# Patient Record
Sex: Male | Born: 2000 | Race: Black or African American | Hispanic: No | Marital: Single | Smoking: Never smoker
Health system: Southern US, Community
[De-identification: ages and names within clinical notes are randomized; demographics above are authoritative.]

## PROBLEM LIST (undated history)

## (undated) DIAGNOSIS — J45909 Unspecified asthma, uncomplicated: Secondary | ICD-10-CM

## (undated) HISTORY — PX: OTHER SURGICAL HISTORY: SHX169

---
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
## 2021-01-04 DIAGNOSIS — Z419 Encounter for procedure for purposes other than remedying health state, unspecified: Secondary | ICD-10-CM | POA: Diagnosis not present

## 2021-02-03 DIAGNOSIS — Z419 Encounter for procedure for purposes other than remedying health state, unspecified: Secondary | ICD-10-CM | POA: Diagnosis not present

## 2021-03-06 DIAGNOSIS — Z419 Encounter for procedure for purposes other than remedying health state, unspecified: Secondary | ICD-10-CM | POA: Diagnosis not present

## 2021-04-05 DIAGNOSIS — Z419 Encounter for procedure for purposes other than remedying health state, unspecified: Secondary | ICD-10-CM | POA: Diagnosis not present

## 2021-04-28 IMAGING — CR DG ELBOW COMPLETE 3+V*R*
1 series · 4 of 4 positions shown · non-contrast
Comparison: None.

CLINICAL DATA: Fall playing basketball

EXAM:
RIGHT ELBOW - COMPLETE 3+ VIEW

[Series 1: dg elbow complete right (3+view) · 0.14mm/px · 4 of 4 slices shown]
[im 1/4]
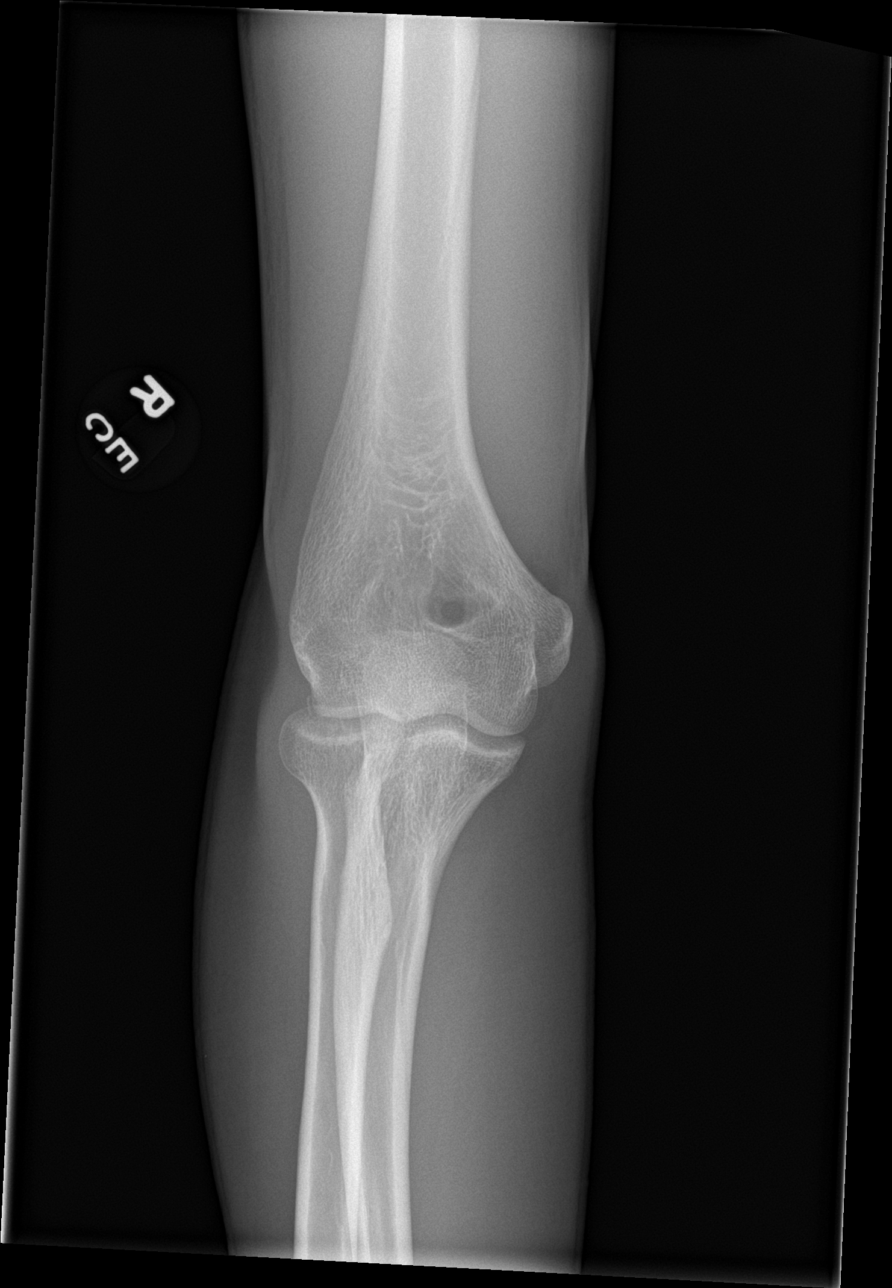
[im 2/4]
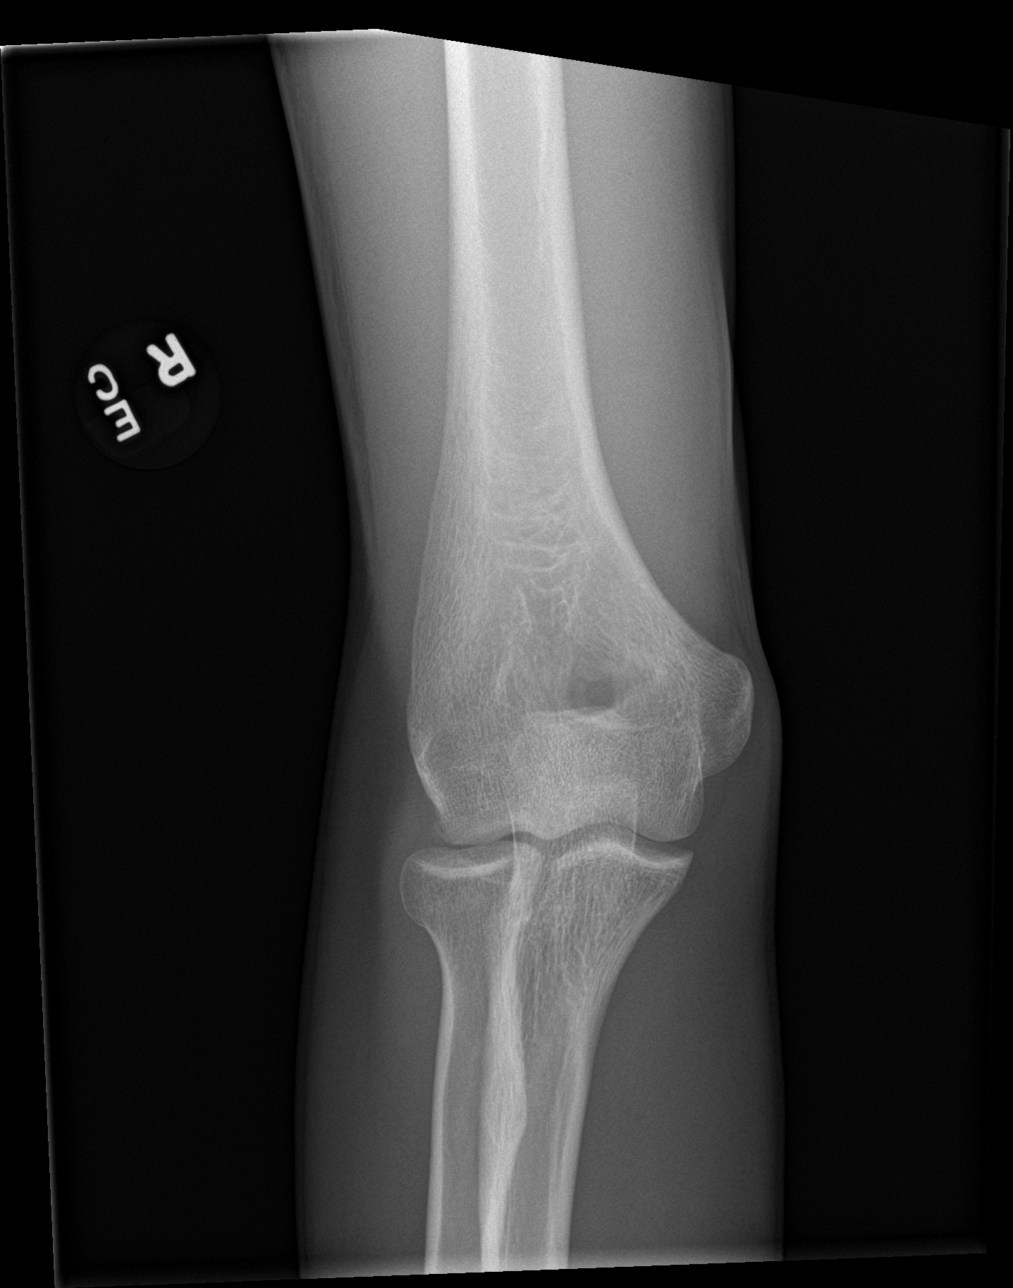
[im 3/4]
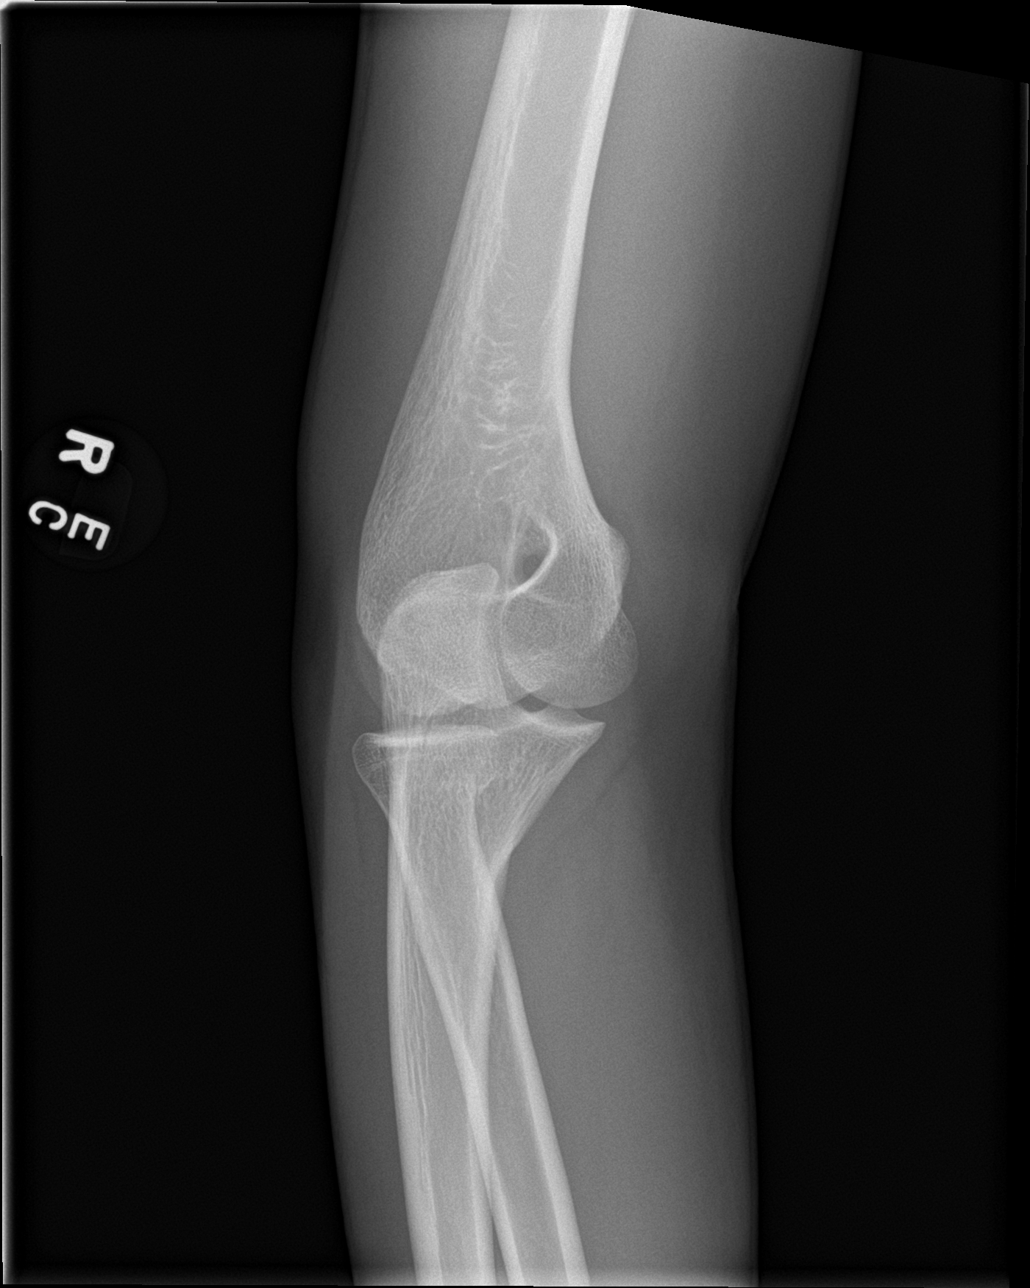
[im 4/4]
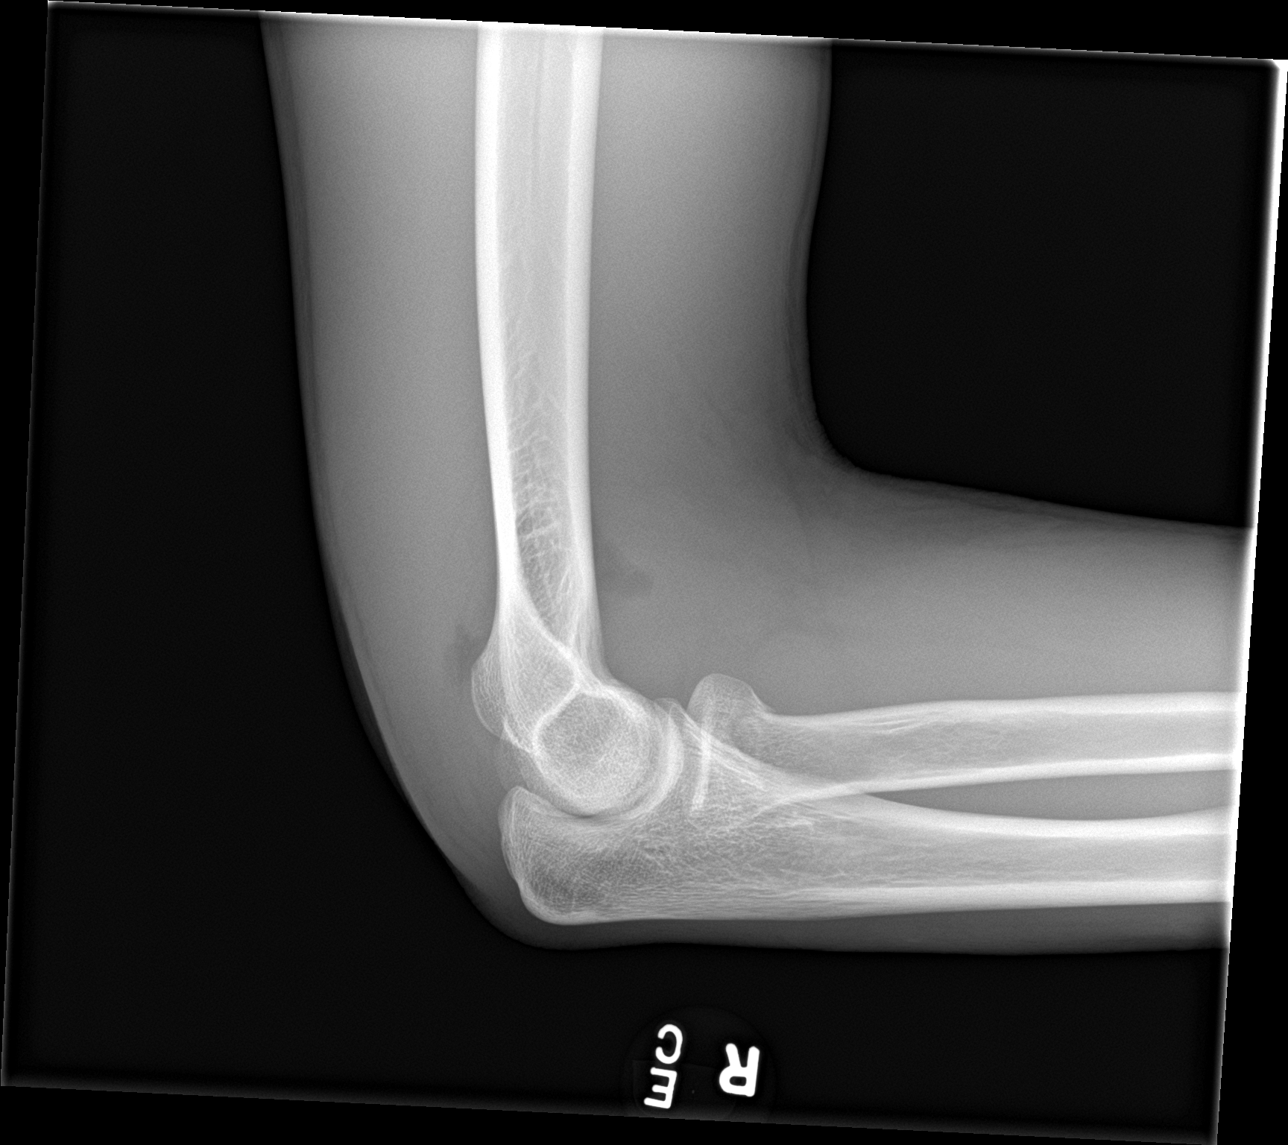

[4 of 4 positions shown; findings below may reference images not displayed]

FINDINGS: There is a right radial neck fracture with associated joint
effusion. Fracture is nondisplaced. No additional fracture. No
subluxation or dislocation.
IMPRESSION: Nondisplaced radial neck fracture.  Associated joint effusion.

## 2021-05-01 DIAGNOSIS — Z68.41 Body mass index (BMI) pediatric, 5th percentile to less than 85th percentile for age: Secondary | ICD-10-CM | POA: Diagnosis not present

## 2021-05-01 DIAGNOSIS — Z113 Encounter for screening for infections with a predominantly sexual mode of transmission: Secondary | ICD-10-CM | POA: Diagnosis not present

## 2021-05-01 DIAGNOSIS — Z713 Dietary counseling and surveillance: Secondary | ICD-10-CM | POA: Diagnosis not present

## 2021-05-01 DIAGNOSIS — J4520 Mild intermittent asthma, uncomplicated: Secondary | ICD-10-CM | POA: Diagnosis not present

## 2021-05-01 DIAGNOSIS — Z0000 Encounter for general adult medical examination without abnormal findings: Secondary | ICD-10-CM | POA: Diagnosis not present

## 2021-05-06 DIAGNOSIS — Z419 Encounter for procedure for purposes other than remedying health state, unspecified: Secondary | ICD-10-CM | POA: Diagnosis not present

## 2021-05-09 DIAGNOSIS — M25561 Pain in right knee: Secondary | ICD-10-CM | POA: Diagnosis not present

## 2021-05-09 DIAGNOSIS — A562 Chlamydial infection of genitourinary tract, unspecified: Secondary | ICD-10-CM | POA: Diagnosis not present

## 2021-06-06 DIAGNOSIS — Z419 Encounter for procedure for purposes other than remedying health state, unspecified: Secondary | ICD-10-CM | POA: Diagnosis not present

## 2021-07-06 DIAGNOSIS — Z419 Encounter for procedure for purposes other than remedying health state, unspecified: Secondary | ICD-10-CM | POA: Diagnosis not present

## 2021-08-06 DIAGNOSIS — Z419 Encounter for procedure for purposes other than remedying health state, unspecified: Secondary | ICD-10-CM | POA: Diagnosis not present

## 2021-08-14 DIAGNOSIS — Z113 Encounter for screening for infections with a predominantly sexual mode of transmission: Secondary | ICD-10-CM | POA: Diagnosis not present

## 2021-08-14 DIAGNOSIS — R300 Dysuria: Secondary | ICD-10-CM | POA: Diagnosis not present

## 2021-09-05 DIAGNOSIS — Z419 Encounter for procedure for purposes other than remedying health state, unspecified: Secondary | ICD-10-CM | POA: Diagnosis not present

## 2021-10-06 DIAGNOSIS — Z419 Encounter for procedure for purposes other than remedying health state, unspecified: Secondary | ICD-10-CM | POA: Diagnosis not present

## 2021-11-06 DIAGNOSIS — Z419 Encounter for procedure for purposes other than remedying health state, unspecified: Secondary | ICD-10-CM | POA: Diagnosis not present

## 2021-12-04 DIAGNOSIS — Z419 Encounter for procedure for purposes other than remedying health state, unspecified: Secondary | ICD-10-CM | POA: Diagnosis not present

## 2022-01-04 DIAGNOSIS — Z419 Encounter for procedure for purposes other than remedying health state, unspecified: Secondary | ICD-10-CM | POA: Diagnosis not present

## 2022-02-03 DIAGNOSIS — Z419 Encounter for procedure for purposes other than remedying health state, unspecified: Secondary | ICD-10-CM | POA: Diagnosis not present

## 2022-03-06 DIAGNOSIS — Z419 Encounter for procedure for purposes other than remedying health state, unspecified: Secondary | ICD-10-CM | POA: Diagnosis not present

## 2022-04-05 DIAGNOSIS — Z419 Encounter for procedure for purposes other than remedying health state, unspecified: Secondary | ICD-10-CM | POA: Diagnosis not present

## 2022-05-06 DIAGNOSIS — Z419 Encounter for procedure for purposes other than remedying health state, unspecified: Secondary | ICD-10-CM | POA: Diagnosis not present

## 2022-06-06 DIAGNOSIS — Z419 Encounter for procedure for purposes other than remedying health state, unspecified: Secondary | ICD-10-CM | POA: Diagnosis not present

## 2022-06-14 ENCOUNTER — Encounter: Payer: Self-pay | Admitting: Emergency Medicine

## 2022-06-14 ENCOUNTER — Other Ambulatory Visit: Payer: Self-pay

## 2022-06-14 ENCOUNTER — Emergency Department
Admission: EM | Admit: 2022-06-14 | Discharge: 2022-06-14 | Disposition: A | Discharge status: Home / Self Care | Payer: Medicaid Other | Attending: Emergency Medicine | Admitting: Emergency Medicine

## 2022-06-14 DIAGNOSIS — R21 Rash and other nonspecific skin eruption: Secondary | ICD-10-CM

## 2022-06-14 MED ORDER — HYDROCORTISONE 0.5 % EX CREA: 1.0000 | TOPICAL_CREAM | Freq: Two times a day (BID) | CUTANEOUS | 0 refills | Status: DC

## 2022-06-14 NOTE — Discharge Instructions (Signed)
You may use the ointment as prescribed.  In the meantime, we will call you if the results of your blood test are positive.  If they are positive, you will require further treatment as we discussed.  You can also see the results on MyChart.  Please return for any new, worsening, or change in symptoms or other concerns.

## 2022-06-14 NOTE — ED Triage Notes (Signed)
Pt reports rash to hands for several days. Pt reports hands itch and burn. Denies any new exposures to soaps, clothes, lotions, cleaning supplies or other things.

## 2022-06-14 NOTE — ED Provider Notes (Signed)
[facility] Provider Note    Event Date/Time   First MD Initiated Contact with Patient 06/14/22 [id number]     (approximate)   History   Rash   HPI  Charles Reilly is a 21 y.o. male who presents today for evaluation of rash noted to his hands.  He reports that he went to his friend's house and his friend has a dog.  Patient reports that he is allergic to dog and pet the dog.  He reports that he has pet the dog before.  However the next day he developed burning and itching to his hands.  He denies any trouble breathing or swallowing.  He reports that he has not noticed a rash anywhere but his fingers and hands.  He denies any chest pain, fevers, chills.  He reports that he is sexually active with females but is not had any high risk intercourse and reports that he always uses condoms.  He has not noticed any lesions to his penis.  He has not noticed any lesions to his feet.  He took allergy medication without significant improvement of his symptoms.  He denies IV drug use.  There are no problems to display for this patient.         Physical Exam   Triage Vital Signs: ED Triage Vitals  Enc Vitals Group     BP 06/14/22 0948 (!) 123/55     Pulse Rate 06/14/22 0948 60     Resp 06/14/22 0948 16     Temp 06/14/22 0948 98.4 F (36.9 C)     Temp Source 06/14/22 0948 Oral     SpO2 06/14/22 0948 100 %     Weight 06/14/22 0940 160 lb (72.6 kg)     Height 06/14/22 0940 6\' 5"  (1.956 m)     Head Circumference --      Peak Flow --      Pain Score 06/14/22 0940 6     Pain Loc --      Pain Edu? --      Excl. in GC? --     Most recent vital signs: Vitals:   06/14/22 0948  BP: (!) 123/55  Pulse: 60  Resp: 16  Temp: 98.4 F (36.9 C)  SpO2: 100%    Physical Exam Vitals and nursing note reviewed.  Constitutional:      General: Awake and alert. No acute distress.    Appearance: Normal appearance. The patient is normal weight.  HENT:     Head: Normocephalic and  atraumatic.     Mouth: Mucous membranes are moist.  Eyes:     General: PERRL. Normal EOMs        Right eye: No discharge.        Left eye: No discharge.     Conjunctiva/sclera: Conjunctivae normal.  Cardiovascular:     Rate and Rhythm: Normal rate and regular rhythm.     Pulses: Normal pulses.     Heart sounds: Normal heart sounds Pulmonary:     Effort: Pulmonary effort is normal. No respiratory distress.     Breath sounds: Normal breath sounds.  Abdominal:     Abdomen is soft. There is no abdominal tenderness. No rebound or guarding. No distention. Musculoskeletal:        General: No swelling. Normal range of motion.     Cervical back: Normal range of motion and neck supple.  Skin:    General: Skin is warm and dry.  Capillary Refill: Capillary refill takes less than 2 seconds.     Findings: Punctate lesions noted to the sides of fingers with 1 lesion noted to the palm of the hand.  Similar-appearing rash noted to the top of left foot, not involving the plantar surfaces.  No involvement of her right foot.  Rash is flat and hyperpigmented.  No vesicles or skin sloughing.  No papules or pustules.  They are pinpoint in size.  No swelling noted.  No splinter hemorrhages noted.  Normal-appearing fingernails. Neurological:     Mental Status: The patient is awake and alert.      ED Results / Procedures / Treatments   Labs (all labs ordered are listed, but only abnormal results are displayed) Labs Reviewed  RPR     EKG     RADIOLOGY     PROCEDURES:  Critical Care performed:   Procedures   MEDICATIONS ORDERED IN ED: Medications - No data to display   IMPRESSION / MDM / ASSESSMENT AND PLAN / ED COURSE  I reviewed the triage vital signs and the nursing notes.   Differential diagnosis includes, but is not limited to, contact dermatitis, herpetic whitlow.  Also considered syphilis or endocarditis given that it involves his hands and feet.  However, most of the rash  is along his fingers as opposed to his palms, and involve the plantar aspect of his foot rather than his soles.  He denies any high risk sexual activity, reports that he uses condoms and has 1 partner.  Also declines any history of IV drug use.  He has no murmur or fever, no chest pain or shortness of breath, no splinter hemorrhages, Osler nodes to suggest endocarditis.  The rash is itchy and painful, consistent with possible contact dermatitis.  There is no blistering or vesicles to suggest herpetic whitlow.  He does admit to pending a dog which he knows that he is allergic to.  He was given hydrocortisone ointment, but he did agree to RPR testing to be sure that this is not secondary syphilis.  He understands that this will take a couple of days to result and that we will call him if it is positive.  He also understands that he can find the results on MyChart.  We did discuss strict return precautions and the importance of close outpatient follow-up.  Patient understands and agrees with plan.  He was discharged in stable condition.  He requested a work note which was provided.   Patient's presentation is most consistent with acute complicated illness / injury requiring diagnostic workup.      FINAL CLINICAL IMPRESSION(S) / ED DIAGNOSES   Final diagnoses:  Rash     Rx / DC Orders   ED Discharge Orders          Ordered    hydrocortisone cream 0.5 %  2 times daily,   Status:  Discontinued        06/14/22 1116    hydrocortisone cream 0.5 %  2 times daily        06/14/22 1124             Note:  This document was prepared using Dragon voice recognition software and may include unintentional dictation errors.   Keturah Shavers 06/14/22 1129    Sharyn Creamer, MD 06/14/22 [phone]

## 2022-06-15 LAB — RPR: RPR Ser Ql: NONREACTIVE

## 2022-07-06 DIAGNOSIS — Z419 Encounter for procedure for purposes other than remedying health state, unspecified: Secondary | ICD-10-CM | POA: Diagnosis not present

## 2022-08-06 DIAGNOSIS — Z419 Encounter for procedure for purposes other than remedying health state, unspecified: Secondary | ICD-10-CM | POA: Diagnosis not present

## 2022-09-05 DIAGNOSIS — Z419 Encounter for procedure for purposes other than remedying health state, unspecified: Secondary | ICD-10-CM | POA: Diagnosis not present

## 2022-09-08 ENCOUNTER — Ambulatory Visit: Payer: Medicaid Other | Admitting: Family Medicine

## 2022-10-06 DIAGNOSIS — Z419 Encounter for procedure for purposes other than remedying health state, unspecified: Secondary | ICD-10-CM | POA: Diagnosis not present

## 2022-11-06 DIAGNOSIS — Z419 Encounter for procedure for purposes other than remedying health state, unspecified: Secondary | ICD-10-CM | POA: Diagnosis not present

## 2022-12-05 DIAGNOSIS — Z419 Encounter for procedure for purposes other than remedying health state, unspecified: Secondary | ICD-10-CM | POA: Diagnosis not present

## 2022-12-31 DIAGNOSIS — M25561 Pain in right knee: Secondary | ICD-10-CM | POA: Diagnosis not present

## 2022-12-31 DIAGNOSIS — M79661 Pain in right lower leg: Secondary | ICD-10-CM | POA: Diagnosis not present

## 2023-01-05 DIAGNOSIS — Z419 Encounter for procedure for purposes other than remedying health state, unspecified: Secondary | ICD-10-CM | POA: Diagnosis not present

## 2023-02-04 DIAGNOSIS — Z419 Encounter for procedure for purposes other than remedying health state, unspecified: Secondary | ICD-10-CM | POA: Diagnosis not present

## 2023-02-10 DIAGNOSIS — S0285XA Fracture of orbit, unspecified, initial encounter for closed fracture: Secondary | ICD-10-CM | POA: Diagnosis not present

## 2023-02-10 DIAGNOSIS — F1210 Cannabis abuse, uncomplicated: Secondary | ICD-10-CM | POA: Diagnosis not present

## 2023-02-10 DIAGNOSIS — M79631 Pain in right forearm: Secondary | ICD-10-CM | POA: Diagnosis not present

## 2023-02-10 DIAGNOSIS — S52251A Displaced comminuted fracture of shaft of ulna, right arm, initial encounter for closed fracture: Secondary | ICD-10-CM | POA: Diagnosis not present

## 2023-02-10 DIAGNOSIS — S59911A Unspecified injury of right forearm, initial encounter: Secondary | ICD-10-CM | POA: Diagnosis not present

## 2023-02-10 DIAGNOSIS — S52391A Other fracture of shaft of radius, right arm, initial encounter for closed fracture: Secondary | ICD-10-CM | POA: Diagnosis not present

## 2023-02-10 DIAGNOSIS — Z885 Allergy status to narcotic agent status: Secondary | ICD-10-CM | POA: Diagnosis not present

## 2023-02-10 DIAGNOSIS — S299XXA Unspecified injury of thorax, initial encounter: Secondary | ICD-10-CM | POA: Diagnosis not present

## 2023-02-10 DIAGNOSIS — R079 Chest pain, unspecified: Secondary | ICD-10-CM | POA: Diagnosis not present

## 2023-02-10 DIAGNOSIS — S0240CA Maxillary fracture, right side, initial encounter for closed fracture: Secondary | ICD-10-CM | POA: Diagnosis not present

## 2023-02-10 DIAGNOSIS — S52601A Unspecified fracture of lower end of right ulna, initial encounter for closed fracture: Secondary | ICD-10-CM | POA: Diagnosis not present

## 2023-02-10 DIAGNOSIS — F17210 Nicotine dependence, cigarettes, uncomplicated: Secondary | ICD-10-CM | POA: Diagnosis not present

## 2023-02-10 DIAGNOSIS — S0231XA Fracture of orbital floor, right side, initial encounter for closed fracture: Secondary | ICD-10-CM | POA: Diagnosis not present

## 2023-02-10 DIAGNOSIS — S51011A Laceration without foreign body of right elbow, initial encounter: Secondary | ICD-10-CM | POA: Diagnosis not present

## 2023-02-10 DIAGNOSIS — R519 Headache, unspecified: Secondary | ICD-10-CM | POA: Diagnosis not present

## 2023-02-10 DIAGNOSIS — S52501A Unspecified fracture of the lower end of right radius, initial encounter for closed fracture: Secondary | ICD-10-CM | POA: Diagnosis not present

## 2023-02-10 DIAGNOSIS — M542 Cervicalgia: Secondary | ICD-10-CM | POA: Diagnosis not present

## 2023-02-10 DIAGNOSIS — M7981 Nontraumatic hematoma of soft tissue: Secondary | ICD-10-CM | POA: Diagnosis not present

## 2023-02-10 DIAGNOSIS — S3993XA Unspecified injury of pelvis, initial encounter: Secondary | ICD-10-CM | POA: Diagnosis not present

## 2023-02-10 DIAGNOSIS — Z23 Encounter for immunization: Secondary | ICD-10-CM | POA: Diagnosis not present

## 2023-02-10 DIAGNOSIS — S52301A Unspecified fracture of shaft of right radius, initial encounter for closed fracture: Secondary | ICD-10-CM | POA: Diagnosis not present

## 2023-02-10 DIAGNOSIS — S199XXA Unspecified injury of neck, initial encounter: Secondary | ICD-10-CM | POA: Diagnosis not present

## 2023-02-10 DIAGNOSIS — S5291XA Unspecified fracture of right forearm, initial encounter for closed fracture: Secondary | ICD-10-CM | POA: Diagnosis not present

## 2023-02-10 DIAGNOSIS — Z4789 Encounter for other orthopedic aftercare: Secondary | ICD-10-CM | POA: Diagnosis not present

## 2023-02-10 DIAGNOSIS — S52591A Other fractures of lower end of right radius, initial encounter for closed fracture: Secondary | ICD-10-CM | POA: Diagnosis not present

## 2023-02-10 DIAGNOSIS — G501 Atypical facial pain: Secondary | ICD-10-CM | POA: Diagnosis not present

## 2023-02-10 DIAGNOSIS — M79601 Pain in right arm: Secondary | ICD-10-CM | POA: Diagnosis not present

## 2023-02-10 DIAGNOSIS — Z91010 Allergy to peanuts: Secondary | ICD-10-CM | POA: Diagnosis not present

## 2023-02-11 DIAGNOSIS — M79631 Pain in right forearm: Secondary | ICD-10-CM | POA: Diagnosis not present

## 2023-02-11 DIAGNOSIS — S52301A Unspecified fracture of shaft of right radius, initial encounter for closed fracture: Secondary | ICD-10-CM | POA: Diagnosis not present

## 2023-02-11 DIAGNOSIS — M25571 Pain in right ankle and joints of right foot: Secondary | ICD-10-CM | POA: Diagnosis not present

## 2023-02-11 DIAGNOSIS — J45909 Unspecified asthma, uncomplicated: Secondary | ICD-10-CM | POA: Diagnosis not present

## 2023-02-11 DIAGNOSIS — S52251A Displaced comminuted fracture of shaft of ulna, right arm, initial encounter for closed fracture: Secondary | ICD-10-CM | POA: Diagnosis not present

## 2023-02-11 DIAGNOSIS — F17210 Nicotine dependence, cigarettes, uncomplicated: Secondary | ICD-10-CM | POA: Diagnosis not present

## 2023-02-11 DIAGNOSIS — S5291XA Unspecified fracture of right forearm, initial encounter for closed fracture: Secondary | ICD-10-CM | POA: Diagnosis not present

## 2023-02-26 DIAGNOSIS — S5291XD Unspecified fracture of right forearm, subsequent encounter for closed fracture with routine healing: Secondary | ICD-10-CM | POA: Diagnosis not present

## 2023-03-07 DIAGNOSIS — Z419 Encounter for procedure for purposes other than remedying health state, unspecified: Secondary | ICD-10-CM | POA: Diagnosis not present

## 2023-03-10 DIAGNOSIS — M25521 Pain in right elbow: Secondary | ICD-10-CM | POA: Diagnosis not present

## 2023-03-10 DIAGNOSIS — M25531 Pain in right wrist: Secondary | ICD-10-CM | POA: Diagnosis not present

## 2023-03-10 DIAGNOSIS — M25621 Stiffness of right elbow, not elsewhere classified: Secondary | ICD-10-CM | POA: Diagnosis not present

## 2023-03-10 DIAGNOSIS — M25631 Stiffness of right wrist, not elsewhere classified: Secondary | ICD-10-CM | POA: Diagnosis not present

## 2023-03-13 ENCOUNTER — Encounter: Payer: Self-pay | Admitting: Physician Assistant

## 2023-03-13 ENCOUNTER — Ambulatory Visit (INDEPENDENT_AMBULATORY_CARE_PROVIDER_SITE_OTHER): Payer: Medicaid Other | Admitting: Physician Assistant

## 2023-03-13 VITALS — BP 122/74 | HR 85 | Ht 77.0 in | Wt 159.0 lb

## 2023-03-13 DIAGNOSIS — L700 Acne vulgaris: Secondary | ICD-10-CM | POA: Diagnosis not present

## 2023-03-13 DIAGNOSIS — Z4802 Encounter for removal of sutures: Secondary | ICD-10-CM | POA: Diagnosis not present

## 2023-03-13 MED ORDER — ADAPALENE 0.1 % EX CREA: TOPICAL_CREAM | Freq: Every day | CUTANEOUS | 2 refills | Status: AC

## 2023-03-13 NOTE — Patient Instructions (Signed)
-  It was a pleasure to see you today! Please review your visit summary for helpful information -I would encourage you to follow your care via MyChart where you can access lab results, notes, messages, and more -If you feel that we did a nice job today, please complete your after-visit survey and leave us a Google review! Your CMA today was Kieandra and your provider was Dan Akayla Brass, PA-C, DMSc   
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
## 2023-03-16 ENCOUNTER — Telehealth: Payer: Self-pay

## 2023-03-16 NOTE — Telephone Encounter (Signed)
-----   Message from Remo Lipps, [place] sent at 03/13/2023 11:48 AM EDT ----- Regarding: Records Please request records from:  [facility] on [address] [facility] in [place]

## 2023-03-16 NOTE — Telephone Encounter (Signed)
Requested records-   [phone]

## 2023-03-17 DIAGNOSIS — M25531 Pain in right wrist: Secondary | ICD-10-CM | POA: Diagnosis not present

## 2023-03-17 DIAGNOSIS — M25631 Stiffness of right wrist, not elsewhere classified: Secondary | ICD-10-CM | POA: Diagnosis not present

## 2023-03-17 DIAGNOSIS — M25521 Pain in right elbow: Secondary | ICD-10-CM | POA: Diagnosis not present

## 2023-03-17 DIAGNOSIS — M25621 Stiffness of right elbow, not elsewhere classified: Secondary | ICD-10-CM | POA: Diagnosis not present

## 2023-03-19 DIAGNOSIS — M25621 Stiffness of right elbow, not elsewhere classified: Secondary | ICD-10-CM | POA: Diagnosis not present

## 2023-03-19 DIAGNOSIS — M25531 Pain in right wrist: Secondary | ICD-10-CM | POA: Diagnosis not present

## 2023-03-19 DIAGNOSIS — M25521 Pain in right elbow: Secondary | ICD-10-CM | POA: Diagnosis not present

## 2023-03-19 DIAGNOSIS — M25631 Stiffness of right wrist, not elsewhere classified: Secondary | ICD-10-CM | POA: Diagnosis not present

## 2023-03-24 DIAGNOSIS — M25531 Pain in right wrist: Secondary | ICD-10-CM | POA: Diagnosis not present

## 2023-03-24 DIAGNOSIS — M25521 Pain in right elbow: Secondary | ICD-10-CM | POA: Diagnosis not present

## 2023-03-24 DIAGNOSIS — M25631 Stiffness of right wrist, not elsewhere classified: Secondary | ICD-10-CM | POA: Diagnosis not present

## 2023-03-24 DIAGNOSIS — M25621 Stiffness of right elbow, not elsewhere classified: Secondary | ICD-10-CM | POA: Diagnosis not present

## 2023-03-26 DIAGNOSIS — M25521 Pain in right elbow: Secondary | ICD-10-CM | POA: Diagnosis not present

## 2023-03-26 DIAGNOSIS — M25531 Pain in right wrist: Secondary | ICD-10-CM | POA: Diagnosis not present

## 2023-03-26 DIAGNOSIS — M25621 Stiffness of right elbow, not elsewhere classified: Secondary | ICD-10-CM | POA: Diagnosis not present

## 2023-03-26 DIAGNOSIS — M25631 Stiffness of right wrist, not elsewhere classified: Secondary | ICD-10-CM | POA: Diagnosis not present

## 2023-03-31 DIAGNOSIS — M25531 Pain in right wrist: Secondary | ICD-10-CM | POA: Diagnosis not present

## 2023-03-31 DIAGNOSIS — M25631 Stiffness of right wrist, not elsewhere classified: Secondary | ICD-10-CM | POA: Diagnosis not present

## 2023-03-31 DIAGNOSIS — M25521 Pain in right elbow: Secondary | ICD-10-CM | POA: Diagnosis not present

## 2023-03-31 DIAGNOSIS — M25621 Stiffness of right elbow, not elsewhere classified: Secondary | ICD-10-CM | POA: Diagnosis not present

## 2023-04-02 DIAGNOSIS — M25531 Pain in right wrist: Secondary | ICD-10-CM | POA: Diagnosis not present

## 2023-04-02 DIAGNOSIS — M25521 Pain in right elbow: Secondary | ICD-10-CM | POA: Diagnosis not present

## 2023-04-02 DIAGNOSIS — M25631 Stiffness of right wrist, not elsewhere classified: Secondary | ICD-10-CM | POA: Diagnosis not present

## 2023-04-02 DIAGNOSIS — M25621 Stiffness of right elbow, not elsewhere classified: Secondary | ICD-10-CM | POA: Diagnosis not present

## 2023-04-06 DIAGNOSIS — Z419 Encounter for procedure for purposes other than remedying health state, unspecified: Secondary | ICD-10-CM | POA: Diagnosis not present

## 2023-04-13 DIAGNOSIS — M25621 Stiffness of right elbow, not elsewhere classified: Secondary | ICD-10-CM | POA: Diagnosis not present

## 2023-04-13 DIAGNOSIS — M25531 Pain in right wrist: Secondary | ICD-10-CM | POA: Diagnosis not present

## 2023-04-13 DIAGNOSIS — M25631 Stiffness of right wrist, not elsewhere classified: Secondary | ICD-10-CM | POA: Diagnosis not present

## 2023-04-13 DIAGNOSIS — M25521 Pain in right elbow: Secondary | ICD-10-CM | POA: Diagnosis not present

## 2023-05-07 DIAGNOSIS — Z419 Encounter for procedure for purposes other than remedying health state, unspecified: Secondary | ICD-10-CM | POA: Diagnosis not present

## 2023-06-07 DIAGNOSIS — Z419 Encounter for procedure for purposes other than remedying health state, unspecified: Secondary | ICD-10-CM | POA: Diagnosis not present

## 2023-07-07 DIAGNOSIS — Z419 Encounter for procedure for purposes other than remedying health state, unspecified: Secondary | ICD-10-CM | POA: Diagnosis not present

## 2023-08-07 DIAGNOSIS — Z419 Encounter for procedure for purposes other than remedying health state, unspecified: Secondary | ICD-10-CM | POA: Diagnosis not present

## 2023-09-06 DIAGNOSIS — Z419 Encounter for procedure for purposes other than remedying health state, unspecified: Secondary | ICD-10-CM | POA: Diagnosis not present

## 2023-10-07 DIAGNOSIS — Z419 Encounter for procedure for purposes other than remedying health state, unspecified: Secondary | ICD-10-CM | POA: Diagnosis not present

## 2023-11-07 DIAGNOSIS — Z419 Encounter for procedure for purposes other than remedying health state, unspecified: Secondary | ICD-10-CM | POA: Diagnosis not present

## 2023-12-05 DIAGNOSIS — Z419 Encounter for procedure for purposes other than remedying health state, unspecified: Secondary | ICD-10-CM | POA: Diagnosis not present

## 2024-01-16 DIAGNOSIS — Z419 Encounter for procedure for purposes other than remedying health state, unspecified: Secondary | ICD-10-CM | POA: Diagnosis not present

## 2024-02-15 DIAGNOSIS — Z419 Encounter for procedure for purposes other than remedying health state, unspecified: Secondary | ICD-10-CM | POA: Diagnosis not present

## 2024-03-17 DIAGNOSIS — Z419 Encounter for procedure for purposes other than remedying health state, unspecified: Secondary | ICD-10-CM | POA: Diagnosis not present

## 2024-04-16 DIAGNOSIS — Z419 Encounter for procedure for purposes other than remedying health state, unspecified: Secondary | ICD-10-CM | POA: Diagnosis not present

## 2024-05-17 DIAGNOSIS — Z419 Encounter for procedure for purposes other than remedying health state, unspecified: Secondary | ICD-10-CM | POA: Diagnosis not present

## 2024-06-15 DIAGNOSIS — L700 Acne vulgaris: Secondary | ICD-10-CM | POA: Diagnosis not present

## 2024-06-17 DIAGNOSIS — Z419 Encounter for procedure for purposes other than remedying health state, unspecified: Secondary | ICD-10-CM | POA: Diagnosis not present

## 2024-07-17 DIAGNOSIS — Z419 Encounter for procedure for purposes other than remedying health state, unspecified: Secondary | ICD-10-CM | POA: Diagnosis not present

## 2024-08-17 DIAGNOSIS — Z419 Encounter for procedure for purposes other than remedying health state, unspecified: Secondary | ICD-10-CM | POA: Diagnosis not present
